# Patient Record
Sex: Male | Born: 1980 | Race: White | Hispanic: No | Marital: Single | Smoking: Former smoker
Health system: Southern US, Community
[De-identification: ages and names within clinical notes are randomized; demographics above are authoritative.]

## PROBLEM LIST (undated history)

## (undated) DIAGNOSIS — F319 Bipolar disorder, unspecified: Secondary | ICD-10-CM

## (undated) HISTORY — DX: Bipolar disorder, unspecified: F31.9

---
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
## 2015-06-28 ENCOUNTER — Other Ambulatory Visit: Payer: Self-pay | Admitting: Occupational Medicine

## 2015-06-28 ENCOUNTER — Ambulatory Visit: Payer: Self-pay

## 2015-06-28 DIAGNOSIS — Z0000 Encounter for general adult medical examination without abnormal findings: Secondary | ICD-10-CM

## 2016-08-05 IMAGING — CR DG CHEST 1V
1 series · 1 of 1 positions shown · non-contrast
Comparison: None.

CLINICAL DATA: Pre-employment physical, current tobacco use

EXAM:
CHEST  1 VIEW

[view not recorded]
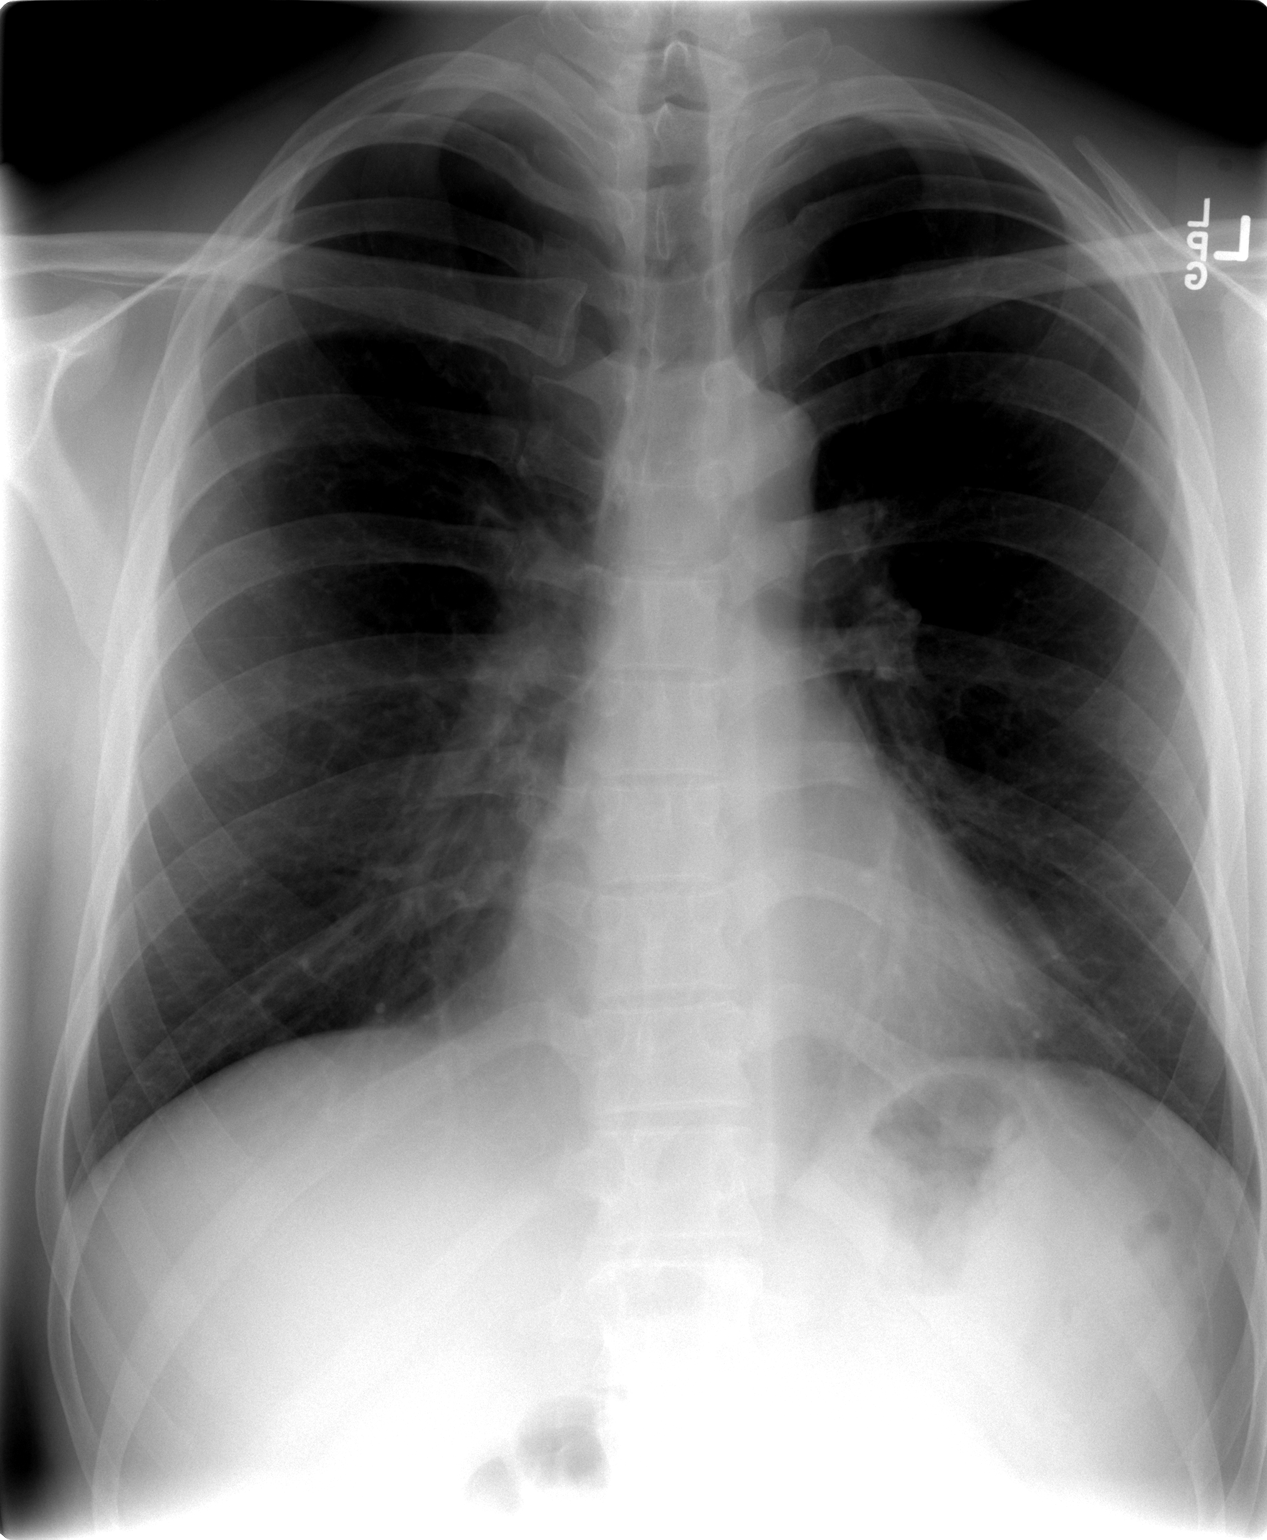

[1 of 1 positions shown; findings below may reference images not displayed]

FINDINGS: The heart size and mediastinal contours are within normal limits.
Both lungs are clear. The visualized skeletal structures are
unremarkable.
IMPRESSION: No active disease.

## 2016-11-19 ENCOUNTER — Encounter (HOSPITAL_COMMUNITY): Payer: Self-pay | Admitting: Psychiatry

## 2016-11-19 ENCOUNTER — Ambulatory Visit (INDEPENDENT_AMBULATORY_CARE_PROVIDER_SITE_OTHER): Payer: BLUE CROSS/BLUE SHIELD | Admitting: Psychiatry

## 2016-11-19 VITALS — BP 126/74 | HR 73 | Resp 16 | Ht 74.0 in | Wt 209.0 lb

## 2016-11-19 DIAGNOSIS — Z79899 Other long term (current) drug therapy: Secondary | ICD-10-CM

## 2016-11-19 DIAGNOSIS — F17210 Nicotine dependence, cigarettes, uncomplicated: Secondary | ICD-10-CM | POA: Diagnosis not present

## 2016-11-19 DIAGNOSIS — F5102 Adjustment insomnia: Secondary | ICD-10-CM

## 2016-11-19 DIAGNOSIS — Z818 Family history of other mental and behavioral disorders: Secondary | ICD-10-CM

## 2016-11-19 DIAGNOSIS — F3160 Bipolar disorder, current episode mixed, unspecified: Secondary | ICD-10-CM

## 2016-11-19 DIAGNOSIS — Z880 Allergy status to penicillin: Secondary | ICD-10-CM

## 2016-11-19 MED ORDER — LITHIUM CARBONATE 600 MG PO CAPS: 600.0000 mg | ORAL_CAPSULE | Freq: Every day | ORAL | 0 refills | Status: DC

## 2016-11-19 MED ORDER — TRAZODONE HCL 100 MG PO TABS: 100.0000 mg | ORAL_TABLET | Freq: Every evening | ORAL | 0 refills | Status: DC | PRN

## 2016-11-19 MED ORDER — LAMOTRIGINE 100 MG PO TABS: 100.0000 mg | ORAL_TABLET | Freq: Three times a day (TID) | ORAL | 0 refills | Status: DC
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
## 2016-11-20 LAB — BASIC METABOLIC PANEL
BUN: 9 mg/dL (ref 7–25)
CO2: 26 mmol/L (ref 20–31)
CREATININE: 1.13 mg/dL (ref 0.60–1.35)
Calcium: 9.6 mg/dL (ref 8.6–10.3)
Chloride: 104 mmol/L (ref 98–110)
Glucose, Bld: 84 mg/dL (ref 65–99)
POTASSIUM: 4.1 mmol/L (ref 3.5–5.3)
Sodium: 139 mmol/L (ref 135–146)

## 2016-11-20 LAB — LITHIUM LEVEL: LITHIUM LVL: 0.3 mmol/L — AB (ref 0.6–1.2)

## 2016-11-20 LAB — TSH: TSH: 7.63 m[IU]/L — AB (ref 0.40–4.50)

## 2016-12-11 ENCOUNTER — Other Ambulatory Visit (HOSPITAL_COMMUNITY): Payer: Self-pay | Admitting: *Deleted

## 2016-12-11 MED ORDER — LAMOTRIGINE 100 MG PO TABS: 100.0000 mg | ORAL_TABLET | Freq: Three times a day (TID) | ORAL | 0 refills | Status: DC

## 2016-12-11 MED ORDER — TRAZODONE HCL 100 MG PO TABS: 100.0000 mg | ORAL_TABLET | Freq: Every evening | ORAL | 0 refills | Status: DC | PRN

## 2016-12-11 MED ORDER — LITHIUM CARBONATE 600 MG PO CAPS: 600.0000 mg | ORAL_CAPSULE | Freq: Every day | ORAL | 0 refills | Status: DC

## 2016-12-11 NOTE — Telephone Encounter (Signed)
Pt request refills for Lamictal, Lithium and Trazodone. Per Dr. Gilmore LarocheAkhtar, refills are authorize for Lamictal 100mg , #90, Lithium 600mg , #30 and Trazodone 100mg , #30. [facility]. Called and informed pt of refill status. Pt's next apt is schedule on 01/16/17. Pt verbalizes understanding.

## 2016-12-12 ENCOUNTER — Ambulatory Visit (HOSPITAL_COMMUNITY): Payer: Self-pay | Admitting: Psychiatry

## 2017-01-16 ENCOUNTER — Ambulatory Visit (INDEPENDENT_AMBULATORY_CARE_PROVIDER_SITE_OTHER): Payer: BLUE CROSS/BLUE SHIELD | Admitting: Psychiatry

## 2017-01-16 ENCOUNTER — Encounter (HOSPITAL_COMMUNITY): Payer: Self-pay | Admitting: Psychiatry

## 2017-01-16 VITALS — BP 118/70 | HR 75 | Resp 16 | Ht 74.0 in | Wt 201.0 lb

## 2017-01-16 DIAGNOSIS — F5102 Adjustment insomnia: Secondary | ICD-10-CM

## 2017-01-16 DIAGNOSIS — F1290 Cannabis use, unspecified, uncomplicated: Secondary | ICD-10-CM

## 2017-01-16 DIAGNOSIS — F3160 Bipolar disorder, current episode mixed, unspecified: Secondary | ICD-10-CM

## 2017-01-16 DIAGNOSIS — Z818 Family history of other mental and behavioral disorders: Secondary | ICD-10-CM

## 2017-01-16 DIAGNOSIS — F17210 Nicotine dependence, cigarettes, uncomplicated: Secondary | ICD-10-CM

## 2017-01-16 DIAGNOSIS — Z79899 Other long term (current) drug therapy: Secondary | ICD-10-CM | POA: Diagnosis not present

## 2017-01-16 MED ORDER — LAMOTRIGINE 100 MG PO TABS: 100.0000 mg | ORAL_TABLET | Freq: Three times a day (TID) | ORAL | 2 refills | Status: DC

## 2017-01-16 MED ORDER — TRAZODONE HCL 100 MG PO TABS: 100.0000 mg | ORAL_TABLET | Freq: Every evening | ORAL | 2 refills | Status: DC | PRN

## 2017-01-16 MED ORDER — LITHIUM CARBONATE 600 MG PO CAPS: 600.0000 mg | ORAL_CAPSULE | Freq: Every day | ORAL | 2 refills | Status: DC

## 2017-01-16 NOTE — Progress Notes (Signed)
[facility] Outpatient Follow up visit   Patient Identification: Andre Steele MRN:  [id number] Date of Evaluation:  01/16/2017 Referral Source: self  Chief Complaint:   Chief Complaint    Follow-up     Visit Diagnosis:    ICD-9-CM ICD-10-CM   1. Bipolar I disorder, most recent episode mixed (HCC) 296.60 F31.60   2. 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 He initially referred himself for management of bipolar disorder and insomnia. He has seen dr. Claudina Lick at [facility] wants to change provider to here.  Last visit he continued his Lamictal, lithium. Have done blood work that shows high TSH. 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 He is a biker but says because of workload he cannot do the bike ride and otherwise that is a modifying factor. Insomnia : reasonable on trazadone    Substance Abuse History in the last 12 months:  Yes.   Marijuana use infrequent or during the weekends  Consequences of Substance Abuse: Medical Consequences:  mood liability  Past Medical History:  Past Medical History:  Diagnosis Date  . Bipolar disorder (HCC)    History reviewed. No pertinent surgical history.  Family Psychiatric History: says Dad probably had bipolar. Mom has anxiety  Family History:  Family History  Problem Relation Age of Onset  . Anxiety disorder Mother   . Anxiety disorder Sister   . Depression Sister   . ADD / ADHD Brother     Social History:   Social History   Social History  . Marital status: Single    Spouse name: N/A  . Number of children: N/A  . Years of education: N/A   Social History Main Topics  . Smoking status: Current Every Day Smoker    Packs/day: 0.25    Years: 17.00    Types: Cigarettes  . Smokeless tobacco: Never Used     Comment: reduce #  of cigarettes  . Alcohol use No  . Drug use: Yes    Types: Marijuana     Comment: weekends   . Sexual activity: Yes    Partners: Female   Other Topics Concern  . None   Social History Narrative  . None     Allergies:   Allergies  Allergen Reactions  . Penicillins Hives    Metabolic Disorder Labs: No results found for: HGBA1C, MPG No results found for: PROLACTIN No results found for: CHOL, TRIG, HDL, CHOLHDL, VLDL, LDLCALC   Current Medications: Current Outpatient Prescriptions  Medication Sig Dispense Refill  . lamoTRIgine (LAMICTAL) 100 MG tablet Take 1 tablet (100 mg total) by mouth 3 (three) times daily. 90 tablet 2  . lithium 600 MG capsule Take 1 capsule (600 mg total) by mouth at bedtime. 30 capsule 2  . traZODone (DESYREL) 100 MG tablet Take 1 tablet (100 mg total) by mouth at bedtime as needed. 30 tablet 2   No current facility-administered medications for this visit.       Psychiatric Specialty Exam: Review of Systems  Cardiovascular: Negative for chest pain and palpitations.  Gastrointestinal: Negative for nausea.  Skin: Negative for itching and rash.  Psychiatric/Behavioral: Negative for depression and suicidal ideas.    Blood pressure 118/70, pulse 75, resp. rate 16, height  (1.88 m), weight 201 lb (91.2 kg), SpO2 94 %.  Body mass index is 25.81 kg/m.  General Appearance: Casual  Eye Contact:  Fair  Speech:  Normal Rate  Volume:  Normal  Mood:  euthymic  Affect:  congruent  Thought Process:  Goal Directed  Orientation:  Full (Time, Place, and Person)  Thought Content:  Logical  Suicidal Thoughts:  No  Homicidal Thoughts:  No  Memory:  Immediate;   Fair Recent;   Fair  Judgement:  Fair  Insight:  Good  Psychomotor Activity:  Normal  Concentration:  Concentration: Fair and Attention Span: Fair  Recall:  Good  Fund of Knowledge:Good  Language: Good  Akathisia:  Negative  Handed:  Right  AIMS (if indicated):    Assets:  Financial  Resources/Insurance Social Support  ADL's:  Intact  Cognition: WNL  Sleep:  fair    Treatment Plan Summary: Medication management and Plan as follows  Bipolar possible mixed, remains in remission. Will continue lithium, lamictal TSH is 7.4. Will refer to primary care to rule out hypothyroidism. He does not feel tired or fatigued he describes possible that lithium may be contributing to high TSH she understands that but does not want to cut down on the lithium since it has been a helpful mood stabilizer.  Insomnia' baseline. Continue trazacone FU 3 months. Referred to primary care. Renewed meds No side effects, rash Gilmore Laroche, Harrietta Guardian, MD 4/12/20184:08 PM

## 2017-04-10 ENCOUNTER — Encounter (HOSPITAL_COMMUNITY): Payer: Self-pay | Admitting: Psychiatry

## 2017-04-10 ENCOUNTER — Ambulatory Visit (INDEPENDENT_AMBULATORY_CARE_PROVIDER_SITE_OTHER): Payer: BLUE CROSS/BLUE SHIELD | Admitting: Psychiatry

## 2017-04-10 VITALS — BP 124/70 | HR 73 | Resp 16 | Ht 74.0 in | Wt 204.0 lb

## 2017-04-10 DIAGNOSIS — F17210 Nicotine dependence, cigarettes, uncomplicated: Secondary | ICD-10-CM

## 2017-04-10 DIAGNOSIS — Z880 Allergy status to penicillin: Secondary | ICD-10-CM

## 2017-04-10 DIAGNOSIS — Z818 Family history of other mental and behavioral disorders: Secondary | ICD-10-CM

## 2017-04-10 DIAGNOSIS — G4700 Insomnia, unspecified: Secondary | ICD-10-CM

## 2017-04-10 DIAGNOSIS — F3170 Bipolar disorder, currently in remission, most recent episode unspecified: Secondary | ICD-10-CM

## 2017-04-10 DIAGNOSIS — F3160 Bipolar disorder, current episode mixed, unspecified: Secondary | ICD-10-CM

## 2017-04-10 DIAGNOSIS — Z79899 Other long term (current) drug therapy: Secondary | ICD-10-CM

## 2017-04-10 DIAGNOSIS — F5102 Adjustment insomnia: Secondary | ICD-10-CM

## 2017-04-10 MED ORDER — LITHIUM CARBONATE 600 MG PO CAPS: 600.0000 mg | ORAL_CAPSULE | Freq: Every day | ORAL | 5 refills | Status: DC

## 2017-04-10 MED ORDER — TRAZODONE HCL 100 MG PO TABS: 100.0000 mg | ORAL_TABLET | Freq: Every evening | ORAL | 5 refills | Status: DC | PRN

## 2017-04-10 MED ORDER — LAMOTRIGINE 100 MG PO TABS: 100.0000 mg | ORAL_TABLET | Freq: Three times a day (TID) | ORAL | 5 refills | Status: DC

## 2017-04-10 NOTE — Progress Notes (Signed)
[facility] Outpatient Follow up [facility]   Patient Identification: Andre EvesCorey Steele MRN:  [id number] Date of Evaluation:  04/10/2017 Referral Source: self  Chief Complaint:   Chief Complaint    Follow-up     Visit Diagnosis:    ICD-10-CM   1. Bipolar I disorder, most recent episode mixed (HCC) F31.60   2. 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 He initially referred himself for management of bipolar disorder and insomnia. He has [facility] wants to change provider to here.  History of sister's high last visit so was sent to primary care he has got his levels done and says primary care feels it is normal when they did the whole panel. 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 Sleep is adequate with trazodone No psychotic symptoms Sporadic or infrequent use of marijuana but understands the risk    Substance Abuse History in the last 12 months:  Yes.   Marijuana use infrequent or during the weekends  Consequences of Substance Abuse: Medical Consequences:  mood liability  Past Medical History:  Past Medical History:  Diagnosis Date  . Bipolar disorder (HCC)    History reviewed. No pertinent surgical history.  Family Psychiatric History: says Dad probably had bipolar. Mom has anxiety  Family History:  Family History  Problem Relation Age of Onset  . Anxiety disorder Mother   . Anxiety disorder Sister   . Depression Sister   . ADD / ADHD Brother     Social History:   Social History   Social History  . Marital status: Single    Spouse name: N/A  . Number of children: N/A  . Years of education: N/A   Social History Main Topics  . Smoking status: Current Every Day Smoker    Packs/day: 0.25    Years: 17.00    Types: Cigarettes  .  Smokeless tobacco: Never Used     Comment: reduce # of cigarettes  . Alcohol use No  . Drug use: Yes    Types: Marijuana     Comment: weekends   . Sexual activity: Yes    Partners: Female   Other Topics Concern  . None   Social History Narrative  . None     Allergies:   Allergies  Allergen Reactions  . Penicillins Hives    Metabolic Disorder Labs: No results found for: HGBA1C, MPG No results found for: PROLACTIN No results found for: CHOL, TRIG, HDL, CHOLHDL, VLDL, LDLCALC   Current Medications: Current Outpatient Prescriptions  Medication Sig Dispense Refill  . lamoTRIgine (LAMICTAL) 100 MG tablet Take 1 tablet (100 mg total) by mouth 3 (three) times daily. 90 tablet 5  . lithium 600 MG capsule Take 1 capsule (600 mg total) by mouth at bedtime. 30 capsule 5  . traZODone (DESYREL) 100 MG tablet Take 1 tablet (100 mg total) by mouth at bedtime as needed. 30 tablet 5   No current facility-administered medications for this visit.       Psychiatric Specialty Exam: Review of Systems  Cardiovascular: Negative for chest pain and palpitations.  Gastrointestinal: Negative for nausea.  Skin: Negative for itching and rash.  Neurological: Negative for tremors.  Psychiatric/Behavioral: Negative for depression and suicidal ideas.  Blood pressure 124/70, pulse 73, resp. rate 16, height 6\' 2"  (1.88 m), weight 204 lb (92.5 kg), SpO2 96 %.Body mass index is 26.19 kg/m.  General Appearance: Casual  Eye Contact:  Fair  Speech:  Normal Rate  Volume:  Normal  Mood:  euthymic  Affect:  congruent  Thought Process:  Goal Directed  Orientation:  Full (Time, Place, and Person)  Thought Content:  Logical  Suicidal Thoughts:  No  Homicidal Thoughts:  No  Memory:  Immediate;   Fair Recent;   Fair  Judgement:  Fair  Insight:  Good  Psychomotor Activity:  Normal  Concentration:  Concentration: Fair and Attention Span: Fair  Recall:  Good  Fund of Knowledge:Good  Language: Good   Akathisia:  Negative  Handed:  Right  AIMS (if indicated):    Assets:  Financial Resources/Insurance Social Support  ADL's:  Intact  Cognition: WNL  Sleep:  fair    Treatment Plan Summary: Medication management and Plan as follows  Bipolar possible mixed, in remission. Continue current meds.  TSH evaluated. Primary care following not hypothyroid   Insomnia'baseline. With trazadone Will send 6 months meds  FU early if needed  No side effects, rash Gilmore Laroche, Harrietta Guardian, MD 7/5/20184:17 PM

## 2017-10-09 ENCOUNTER — Encounter (HOSPITAL_COMMUNITY): Payer: Self-pay | Admitting: Psychiatry

## 2017-10-09 ENCOUNTER — Other Ambulatory Visit: Payer: Self-pay

## 2017-10-09 ENCOUNTER — Ambulatory Visit (INDEPENDENT_AMBULATORY_CARE_PROVIDER_SITE_OTHER): Payer: BLUE CROSS/BLUE SHIELD | Admitting: Psychiatry

## 2017-10-09 VITALS — BP 126/86 | HR 74 | Ht 74.0 in | Wt 213.0 lb

## 2017-10-09 DIAGNOSIS — F3160 Bipolar disorder, current episode mixed, unspecified: Secondary | ICD-10-CM | POA: Diagnosis not present

## 2017-10-09 DIAGNOSIS — F1210 Cannabis abuse, uncomplicated: Secondary | ICD-10-CM

## 2017-10-09 DIAGNOSIS — Z87891 Personal history of nicotine dependence: Secondary | ICD-10-CM

## 2017-10-09 DIAGNOSIS — Z818 Family history of other mental and behavioral disorders: Secondary | ICD-10-CM

## 2017-10-09 DIAGNOSIS — F5102 Adjustment insomnia: Secondary | ICD-10-CM | POA: Diagnosis not present

## 2017-10-09 MED ORDER — LAMOTRIGINE 100 MG PO TABS: 100.0000 mg | ORAL_TABLET | Freq: Three times a day (TID) | ORAL | 5 refills | Status: DC

## 2017-10-09 MED ORDER — TRAZODONE HCL 100 MG PO TABS: 100.0000 mg | ORAL_TABLET | Freq: Every evening | ORAL | 5 refills | Status: DC | PRN

## 2017-10-09 MED ORDER — LITHIUM CARBONATE 600 MG PO CAPS: 600.0000 mg | ORAL_CAPSULE | Freq: Every day | ORAL | 5 refills | Status: DC

## 2017-10-09 NOTE — Progress Notes (Signed)
[facility] Outpatient Follow up visit   Patient Identification: Andre Steele MRN:  [id number] Date of Evaluation:  10/09/2017 Referral Source: self  Chief Complaint:   Chief Complaint    Follow-up; Other     Visit Diagnosis:  No diagnosis found.  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 He initially referred himself for management of bipolar disorder and insomnia. He has seen dr. Claudina Steele at [facility] wants to change provider to here.  Doing fair on meds. Mood stable. Married, has a son and works  Sleep fair, energy fair   Substance Abuse History in the last 12 months:  Yes.   Marijuana use infrequent or during the weekends  Consequences of Substance Abuse: Medical Consequences:  mood liability  Past Medical History:  Past Medical History:  Diagnosis Date  . Bipolar disorder (HCC)    History reviewed. No pertinent surgical history.  Family Psychiatric History: says Dad probably had bipolar. Mom has anxiety  Family History:  Family History  Problem Relation Age of Onset  . Anxiety disorder Mother   . Anxiety disorder Sister   . Depression Sister   . ADD / ADHD Brother     Social History:   Social History   Socioeconomic History  . Marital status: Single    Spouse name: None  . Number of children: None  . Years of education: None  . Highest education level: None  Social Needs  . Financial resource strain: None  . Food insecurity - worry: None  . Food insecurity - inability: None  . Transportation needs - medical: None  . Transportation needs - non-medical: None  Occupational History  . None  Tobacco Use  . Smoking status: Former Smoker    Years: 17.00    Types: Cigarettes    Last attempt to quit: 06/09/2017    Years since quitting: 0.3  . Smokeless tobacco: Never Used  . Tobacco comment: reduce # of cigarettes  Substance and Sexual Activity  . Alcohol use: No   . Drug use: Yes    Types: Marijuana    Comment: weekends   . Sexual activity: Yes    Partners: Female  Other Topics Concern  . None  Social History Narrative  . None     Allergies:   Allergies  Allergen Reactions  . Penicillins Hives    Metabolic Disorder Labs: No results found for: HGBA1C, MPG No results found for: PROLACTIN No results found for: CHOL, TRIG, HDL, CHOLHDL, VLDL, LDLCALC   Current Medications: Current Outpatient Medications  Medication Sig Dispense Refill  . lamoTRIgine (LAMICTAL) 100 MG tablet Take 1 tablet (100 mg total) by mouth 3 (three) times daily. 90 tablet 5  . lithium 600 MG capsule Take 1 capsule (600 mg total) by mouth at bedtime. 30 capsule 5  . traZODone (DESYREL) 100 MG tablet Take 1 tablet (100 mg total) by mouth at bedtime as needed. 30 tablet 5   No current facility-administered medications for this visit.       Psychiatric Specialty Exam: Review of Systems  Cardiovascular: Negative for chest pain and palpitations.  Gastrointestinal: Negative for nausea.  Skin: Negative for itching and rash.  Neurological: Negative for tremors.  Psychiatric/Behavioral: Negative for depression and suicidal ideas.    Blood pressure 126/86, pulse 74, height 6\' 2"  (1.88 m), weight 213 lb (96.6 kg).Body mass index is 27.35 kg/m.  General Appearance: Casual  Eye Contact:  Fair  Speech:  Normal Rate  Volume:  Normal  Mood: fair  Affect:  congruent  Thought Process:  Goal Directed  Orientation:  Full (Time, Place, and Person)  Thought Content:  Logical  Suicidal Thoughts:  No  Homicidal Thoughts:  No  Memory:  Immediate;   Fair Recent;   Fair  Judgement:  Fair  Insight:  Good  Psychomotor Activity:  Normal  Concentration:  Concentration: Fair and Attention Span: Fair  Recall:  Good  Fund of Knowledge:Good  Language: Good  Akathisia:  Negative  Handed:  Right  AIMS (if indicated):    Assets:  Financial Resources/Insurance Social Support   ADL's:  Intact  Cognition: WNL  Sleep:  fair    Treatment Plan Summary: Medication management and Plan as follows  Bipolar possible mixed, in remission. Stable. Continue meds TSH evaluated. Primary care following not hypothyroid   Insomnia'baseline. Continue with trazadone Lithium level were low. No need to increase dose as he is stable. Fu 6 months. Renewed meds.  Andre Steele'    Andre Weedon, MD 1/3/20194:40 PM

## 2018-03-31 ENCOUNTER — Ambulatory Visit (HOSPITAL_COMMUNITY): Payer: Self-pay | Admitting: Psychiatry

## 2018-04-06 ENCOUNTER — Encounter (HOSPITAL_COMMUNITY): Payer: Self-pay | Admitting: Psychiatry

## 2018-04-06 ENCOUNTER — Ambulatory Visit (INDEPENDENT_AMBULATORY_CARE_PROVIDER_SITE_OTHER): Payer: BLUE CROSS/BLUE SHIELD | Admitting: Psychiatry

## 2018-04-06 ENCOUNTER — Other Ambulatory Visit: Payer: Self-pay

## 2018-04-06 VITALS — BP 142/90 | HR 77 | Ht 74.0 in | Wt 201.0 lb

## 2018-04-06 DIAGNOSIS — F3160 Bipolar disorder, current episode mixed, unspecified: Secondary | ICD-10-CM | POA: Diagnosis not present

## 2018-04-06 DIAGNOSIS — Z818 Family history of other mental and behavioral disorders: Secondary | ICD-10-CM

## 2018-04-06 DIAGNOSIS — F5102 Adjustment insomnia: Secondary | ICD-10-CM

## 2018-04-06 DIAGNOSIS — Z87891 Personal history of nicotine dependence: Secondary | ICD-10-CM | POA: Diagnosis not present

## 2018-04-06 MED ORDER — TRAZODONE HCL 100 MG PO TABS: 100.0000 mg | ORAL_TABLET | Freq: Every evening | ORAL | 6 refills | Status: DC | PRN

## 2018-04-06 MED ORDER — LAMOTRIGINE 100 MG PO TABS: 100.0000 mg | ORAL_TABLET | Freq: Three times a day (TID) | ORAL | 6 refills | Status: DC

## 2018-04-06 MED ORDER — LITHIUM CARBONATE 600 MG PO CAPS: 600.0000 mg | ORAL_CAPSULE | Freq: Every day | ORAL | 6 refills | Status: DC

## 2018-04-06 NOTE — Progress Notes (Signed)
[facility] Outpatient Follow up visit   Patient Identification: Andre Steele MRN:  [id number] Date of Evaluation:  04/06/2018 Referral Source: self  Chief Complaint:   Chief Complaint    Follow-up; Other     Visit Diagnosis:    ICD-10-CM   1. Bipolar I disorder, most recent episode mixed (HCC) F31.60 Lithium level    TSH + free T4    CBC  2. Adjustment insomnia F51.02     History of Present Illness: 37 years old currently single Caucasian male living with his fiance and 83 years old son working as an Personnel officer. He initially referred himself for management of bipolar disorder and insomnia. He has seen dr. Claudina Lick at [facility] wants to change provider to here.  Stable on meds.  onlithium  Sleep fair on trazadone   Modifying factor; son and job  Substance Abuse History in the last 12 months:  Yes.   Marijuana use infrequent or during the weekends  Consequences of Substance Abuse: Medical Consequences:  mood liability  Past Medical History:  Past Medical History:  Diagnosis Date  . Bipolar disorder (HCC)    History reviewed. No pertinent surgical history.  Family Psychiatric History: says Dad probably had bipolar. Mom has anxiety  Family History:  Family History  Problem Relation Age of Onset  . Anxiety disorder Mother   . Anxiety disorder Sister   . Depression Sister   . ADD / ADHD Brother     Social History:   Social History   Socioeconomic History  . Marital status: Single    Spouse name: Not on file  . Number of children: Not on file  . Years of education: Not on file  . Highest education level: Not on file  Occupational History  . Not on file  Social Needs  . Financial resource strain: Not on file  . Food insecurity:    Worry: Not on file    Inability: Not on file  . Transportation needs:    Medical: Not on file    Non-medical: Not on file  Tobacco Use  . Smoking status: Former Smoker    Years: 17.00    Types: Cigarettes    Last attempt to quit:  06/09/2017    Years since quitting: 0.8  . Smokeless tobacco: Never Used  . Tobacco comment: reduce # of cigarettes  Substance and Sexual Activity  . Alcohol use: No  . Drug use: Yes    Types: Marijuana    Comment: weekends   . Sexual activity: Yes    Partners: Female  Lifestyle  . Physical activity:    Days per week: Not on file    Minutes per session: Not on file  . Stress: Not on file  Relationships  . Social connections:    Talks on phone: Not on file    Gets together: Not on file    Attends religious service: Not on file    Active member of club or organization: Not on file    Attends meetings of clubs or organizations: Not on file    Relationship status: Not on file  Other Topics Concern  . Not on file  Social History Narrative  . Not on file     Allergies:   Allergies  Allergen Reactions  . Penicillins Hives    Metabolic Disorder Labs: No results found for: HGBA1C, MPG No results found for: PROLACTIN No results found for: CHOL, TRIG, HDL, CHOLHDL, VLDL, LDLCALC   Current Medications: Current Outpatient Medications  Medication  Sig Dispense Refill  . lamoTRIgine (LAMICTAL) 100 MG tablet Take 1 tablet (100 mg total) by mouth 3 (three) times daily. 90 tablet 6  . lithium 600 MG capsule Take 1 capsule (600 mg total) by mouth at bedtime. 30 capsule 6  . traZODone (DESYREL) 100 MG tablet Take 1 tablet (100 mg total) by mouth at bedtime as needed. 30 tablet 6   No current facility-administered medications for this visit.       Psychiatric Specialty Exam: Review of Systems  Cardiovascular: Negative for chest pain.  Gastrointestinal: Negative for nausea.  Skin: Negative for itching and rash.  Neurological: Negative for tremors.  Psychiatric/Behavioral: Negative for depression and suicidal ideas.    Blood pressure (!) 142/90, pulse 77, height 6\' 2"  (1.88 m), weight 201 lb (91.2 kg).Body mass index is 25.81 kg/m.  General Appearance: Casual  Eye Contact:   Fair  Speech:  Normal Rate  Volume:  Normal  Mood: fair  Affect:  congruent  Thought Process:  Goal Directed  Orientation:  Full (Time, Place, and Person)  Thought Content:  Logical  Suicidal Thoughts:  No  Homicidal Thoughts:  No  Memory:  Immediate;   Fair Recent;   Fair  Judgement:  Fair  Insight:  Good  Psychomotor Activity:  Normal  Concentration:  Concentration: Fair and Attention Span: Fair  Recall:  Good  Fund of Knowledge:Good  Language: Good  Akathisia:  Negative  Handed:  Right  AIMS (if indicated):    Assets:  Financial Resources/Insurance Social Support  ADL's:  Intact  Cognition: WNL  Sleep:  fair    Treatment Plan Summary: Medication management and Plan as follows  Bipolar possible mixed,stable. Continue lamictal. Lithium. No rahs  will send for labs work and lithium levels    Insomnia' baseline. Reviewed sleep hygine, continue trazadone and at times take half tablet  Fu 7275m . Renewed meds Andre Steele'    Andre Giddens, MD 7/1/20193:46 PM

## 2018-04-06 NOTE — Patient Instructions (Signed)
PRIMARY CARE TO DO LABWORK INCLUDING TSH FREE T4 LEVELS LITHIUM AND CBC,  PLEASE LET US KNOW RESULTS

## 2018-04-07 LAB — TSH+FREE T4: TSH W/REFLEX TO FT4: 8.49 m[IU]/L — AB (ref 0.40–4.50)

## 2018-04-07 LAB — CBC
HEMATOCRIT: 42.7 % (ref 38.5–50.0)
Hemoglobin: 15.2 g/dL (ref 13.2–17.1)
MCH: 32.2 pg (ref 27.0–33.0)
MCHC: 35.6 g/dL (ref 32.0–36.0)
MCV: 90.5 fL (ref 80.0–100.0)
MPV: 9.6 fL (ref 7.5–12.5)
PLATELETS: 250 10*3/uL (ref 140–400)
RBC: 4.72 10*6/uL (ref 4.20–5.80)
RDW: 12 % (ref 11.0–15.0)
WBC: 9.3 10*3/uL (ref 3.8–10.8)

## 2018-04-07 LAB — T4, FREE: Free T4: 1.1 ng/dL (ref 0.8–1.8)

## 2018-04-07 LAB — LITHIUM LEVEL: LITHIUM LVL: 0.4 mmol/L — AB (ref 0.6–1.2)

## 2018-11-05 ENCOUNTER — Other Ambulatory Visit: Payer: Self-pay

## 2018-11-05 ENCOUNTER — Ambulatory Visit (INDEPENDENT_AMBULATORY_CARE_PROVIDER_SITE_OTHER): Payer: BLUE CROSS/BLUE SHIELD | Admitting: Psychiatry

## 2018-11-05 ENCOUNTER — Encounter (HOSPITAL_COMMUNITY): Payer: Self-pay | Admitting: Psychiatry

## 2018-11-05 VITALS — BP 118/78 | HR 76 | Ht 74.0 in | Wt 213.0 lb

## 2018-11-05 DIAGNOSIS — F5102 Adjustment insomnia: Secondary | ICD-10-CM

## 2018-11-05 DIAGNOSIS — F3160 Bipolar disorder, current episode mixed, unspecified: Secondary | ICD-10-CM | POA: Diagnosis not present

## 2018-11-05 MED ORDER — TRAZODONE HCL 100 MG PO TABS: 100.0000 mg | ORAL_TABLET | Freq: Every evening | ORAL | 5 refills | Status: DC | PRN

## 2018-11-05 MED ORDER — LAMOTRIGINE 100 MG PO TABS: 100.0000 mg | ORAL_TABLET | Freq: Three times a day (TID) | ORAL | 5 refills | Status: DC

## 2018-11-05 NOTE — Progress Notes (Signed)
[facility] Outpatient Follow up visit   Patient Identification: Deangleo Kubecka MRN:  [id number] Date of Evaluation:  11/05/2018 Referral Source: self  Chief Complaint:   Chief Complaint    Follow-up; Other     Visit Diagnosis:    ICD-10-CM   1. Bipolar I disorder, most recent episode mixed (HCC) F31.60   2. Adjustment insomnia F51.02     History of Present Illness: 38 years old currently single Caucasian male living with his fiance and 74 years old son working as an Personnel officer. He initially referred himself for management of bipolar disorder and insomnia. He has seen dr. Claudina Lick at [facility] wants to change provider to here.  Remains stable on medication on lithium and Lamictal trazodone helps him sleep modifying factor is his job his wife  Labs were done last year he does not want to do it again otherwise comfortable and no side effects reported   Substance Abuse History in the last 12 months:  Yes.   Marijuana use infrequent or during the weekends  Consequences of Substance Abuse: Medical Consequences:  mood liability  Past Medical History:  Past Medical History:  Diagnosis Date  . Bipolar disorder (HCC)    History reviewed. No pertinent surgical history.  Family Psychiatric History: says Dad probably had bipolar. Mom has anxiety  Family History:  Family History  Problem Relation Age of Onset  . Anxiety disorder Mother   . Anxiety disorder Sister   . Depression Sister   . ADD / ADHD Brother     Social History:   Social History   Socioeconomic History  . Marital status: Single    Spouse name: Not on file  . Number of children: Not on file  . Years of education: Not on file  . Highest education level: Not on file  Occupational History  . Not on file  Social Needs  . Financial resource strain: Not on file  . Food insecurity:    Worry: Not on file    Inability: Not on file  . Transportation needs:    Medical: Not on file    Non-medical: Not on file  Tobacco Use   . Smoking status: Former Smoker    Years: 17.00    Types: Cigarettes    Last attempt to quit: 06/09/2017    Years since quitting: 1.4  . Smokeless tobacco: Never Used  . Tobacco comment: reduce # of cigarettes  Substance and Sexual Activity  . Alcohol use: No  . Drug use: Yes    Types: Marijuana    Comment: weekends   . Sexual activity: Yes    Partners: Female  Lifestyle  . Physical activity:    Days per week: Not on file    Minutes per session: Not on file  . Stress: Not on file  Relationships  . Social connections:    Talks on phone: Not on file    Gets together: Not on file    Attends religious service: Not on file    Active member of club or organization: Not on file    Attends meetings of clubs or organizations: Not on file    Relationship status: Not on file  Other Topics Concern  . Not on file  Social History Narrative  . Not on file     Allergies:   Allergies  Allergen Reactions  . Penicillins Hives    Metabolic Disorder Labs: No results found for: HGBA1C, MPG No results found for: PROLACTIN No results found for: CHOL, TRIG, HDL,  CHOLHDL, VLDL, LDLCALC   Current Medications: Current Outpatient Medications  Medication Sig Dispense Refill  . lamoTRIgine (LAMICTAL) 100 MG tablet Take 1 tablet (100 mg total) by mouth 3 (three) times daily. 90 tablet 5  . lithium 600 MG capsule Take 1 capsule (600 mg total) by mouth at bedtime. 30 capsule 6  . Multiple Vitamin (THERA) TABS Take by mouth.    . traZODone (DESYREL) 100 MG tablet Take 1 tablet (100 mg total) by mouth at bedtime as needed. 30 tablet 5   No current facility-administered medications for this visit.       Psychiatric Specialty Exam: Review of Systems  Cardiovascular: Negative for palpitations.  Gastrointestinal: Negative for nausea.  Skin: Negative for itching and rash.  Neurological: Negative for tremors.  Psychiatric/Behavioral: Negative for depression and suicidal ideas.    Blood  pressure 118/78, pulse 76, height 6\' 2"  (1.88 m), weight 213 lb (96.6 kg).Body mass index is 27.35 kg/m.  General Appearance: Casual  Eye Contact:  Fair  Speech:  Normal Rate  Volume:  Normal  Mood:fair.   Affect:  congruent  Thought Process:  Goal Directed  Orientation:  Full (Time, Place, and Person)  Thought Content:  Logical  Suicidal Thoughts:  No  Homicidal Thoughts:  No  Memory:  Immediate;   Fair Recent;   Fair  Judgement:  Fair  Insight:  Good  Psychomotor Activity:  Normal  Concentration:  Concentration: Fair and Attention Span: Fair  Recall:  Good  Fund of Knowledge:Good  Language: Good  Akathisia:  Negative  Handed:  Right  AIMS (if indicated):    Assets:  Financial Resources/Insurance Social Support  ADL's:  Intact  Cognition: WNL  Sleep:  fair    Treatment Plan Summary: Medication management and Plan as follows  Bipolar possible mixed, stable. Reviewed prior labs. Renew meds   Insomnia' baseline. Reviewed sleep hygine, continue trazadone and at times take half tablet  Fu 5861m . Renewed meds [name]'    Vanellope Passmore, MD 1/30/20203:36 PM

## 2018-11-18 ENCOUNTER — Other Ambulatory Visit (HOSPITAL_COMMUNITY): Payer: Self-pay | Admitting: Psychiatry

## 2018-12-25 ENCOUNTER — Other Ambulatory Visit (HOSPITAL_COMMUNITY): Payer: Self-pay | Admitting: Psychiatry

## 2019-03-24 ENCOUNTER — Other Ambulatory Visit (HOSPITAL_COMMUNITY): Payer: Self-pay | Admitting: Psychiatry

## 2019-05-24 ENCOUNTER — Other Ambulatory Visit (HOSPITAL_COMMUNITY): Payer: Self-pay | Admitting: Psychiatry

## 2019-06-01 ENCOUNTER — Ambulatory Visit (INDEPENDENT_AMBULATORY_CARE_PROVIDER_SITE_OTHER): Payer: Self-pay | Admitting: Psychiatry

## 2019-06-01 ENCOUNTER — Encounter (HOSPITAL_COMMUNITY): Payer: Self-pay | Admitting: Psychiatry

## 2019-06-01 ENCOUNTER — Other Ambulatory Visit (HOSPITAL_COMMUNITY): Payer: Self-pay | Admitting: Psychiatry

## 2019-06-01 DIAGNOSIS — F3160 Bipolar disorder, current episode mixed, unspecified: Secondary | ICD-10-CM

## 2019-06-01 DIAGNOSIS — F5102 Adjustment insomnia: Secondary | ICD-10-CM

## 2019-06-01 MED ORDER — TRAZODONE HCL 100 MG PO TABS: 100.0000 mg | ORAL_TABLET | Freq: Every evening | ORAL | 4 refills | Status: DC | PRN

## 2019-06-01 MED ORDER — LITHIUM CARBONATE 600 MG PO CAPS: ORAL_CAPSULE | ORAL | 5 refills | Status: DC

## 2019-06-01 MED ORDER — LAMOTRIGINE 100 MG PO TABS: 100.0000 mg | ORAL_TABLET | Freq: Three times a day (TID) | ORAL | 5 refills | Status: DC

## 2019-06-01 NOTE — Progress Notes (Addendum)
[facility] Out[facility] Follow [facility]   Patient Identification: Andre EvesCorey Ruane MRN:  [id number] Date of Evaluation:  06/01/2019 Referral Source: self  Chief Complaint:    bipolar follow up  Visit Diagnosis:    ICD-10-CM   1. Bipolar I disorder, most recent episode mixed (HCC)  F31.60   2. Adjustment insomnia  F51.02    Virtual Visit via Telephone Note  I connected with Andre Steele on 06/01/19 at  3:30 PM EDT by telephone and verified that I am speaking with the correct person using two identifiers.   I discussed the limitations, risks, security and privacy concerns of performing an evaluation and management service by telephone and the availability of in person appointments. I also discussed with the patient that there may be a patient responsible charge related to this service. The patient expressed understanding and agreed to proceed. 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 He initially referred himself for management of bipolar disorder and insomnia. [facility] wants to change provider to here.  Remains stable on meds. No side effects or rash Wants to do lab work later months Supportive family Work is fine Substance Abuse History in the last 12 months:  Yes.   but did not endorse regular use  Marijuana use infrequent or during the weekends  Consequences of Substance Abuse: Medical Consequences:  mood liability  Past Medical History:  Past Medical History:  Diagnosis Date  . Bipolar disorder (HCC)    History reviewed. No pertinent surgical history.  Family Psychiatric History: says Dad probably had bipolar. Mom has anxiety  Family History:  Family History  Problem Relation Age of Onset  . Anxiety disorder Mother   . Anxiety disorder Sister   . Depression Sister   . ADD / ADHD Brother     Social History:   Social History   Socioeconomic History   . Marital status: Single    Spouse name: Not on file  . Number of children: Not on file  . Years of education: Not on file  . Highest education level: Not on file  Occupational History  . Not on file  Social Needs  . Financial resource strain: Not on file  . Food insecurity    Worry: Not on file    Inability: Not on file  . Transportation needs    Medical: Not on file    Non-medical: Not on file  Tobacco Use  . Smoking status: Former Smoker    Years: 17.00    Types: Cigarettes    Quit date: 06/09/2017    Years since quitting: 1.9  . Smokeless tobacco: Never Used  . Tobacco comment: reduce # of cigarettes  Substance and Sexual Activity  . Alcohol use: No  . Drug use: Yes    Types: Marijuana    Comment: weekends   . Sexual activity: Yes    Partners: Female  Lifestyle  . Physical activity    Days per week: Not on file    Minutes per session: Not on file  . Stress: Not on file  Relationships  . Social Musicianconnections    Talks on phone: Not on file    Gets together: Not on file    Attends religious service: Not on file    Active member of club or organization: Not on file    Attends meetings of clubs or organizations: Not on file  Relationship status: Not on file  Other Topics Concern  . Not on file  Social History Narrative  . Not on file     Allergies:   Allergies  Allergen Reactions  . Penicillins Hives    Metabolic Disorder Labs: No results found for: HGBA1C, MPG No results found for: PROLACTIN No results found for: CHOL, TRIG, HDL, CHOLHDL, VLDL, LDLCALC   Current Medications: Current Outpatient Medications  Medication Sig Dispense Refill  . lamoTRIgine (LAMICTAL) 100 MG tablet Take 1 tablet (100 mg total) by mouth 3 (three) times daily. 90 tablet 5  . lithium 600 MG capsule Take 1 capsule by mouth once daily at bedtime 30 capsule 5  . Multiple Vitamin (THERA) TABS Take by mouth.    . traZODone (DESYREL) 100 MG tablet Take 1 tablet (100 mg total) by  mouth at bedtime as needed. 30 tablet 4   No current facility-administered medications for this visit.       Psychiatric Specialty Exam: Review of Systems  Cardiovascular: Negative for palpitations.  Skin: Negative for itching and rash.  Psychiatric/Behavioral: Negative for depression and suicidal ideas.    There were no vitals taken for this visit.There is no height or weight on file to calculate BMI.  General Appearance:  Eye Contact:  Speech:  Normal Rate  Volume:  Normal  Mood:fair  Affect:  congruent  Thought Process:  Goal Directed  Orientation:  Full (Time, Place, and Person)  Thought Content:  Logical  Suicidal Thoughts:  No  Homicidal Thoughts:  No  Memory:  Immediate;   Fair Recent;   Fair  Judgement:  Fair  Insight:  Good  Psychomotor Activity:  Normal  Concentration:  Concentration: Fair and Attention Span: Fair  Recall:  Good  Fund of Knowledge:Good  Language: Good  Akathisia:  Negative  Handed:  Right  AIMS (if indicated):    Assets:  Financial Resources/Insurance Social Support  ADL's:  Intact  Cognition: WNL  Sleep:  fair    Treatment Plan Summary: Medication management and Plan as follows  Bipolar possible mixed: remain stable. Continue lithium, lamictal  Insomnia' baseline. Reviewed sleep hygine, continue trazadone and at times take half tablet  Fu 69m . Renewed meds '  I discussed the assessment and treatment plan with the patient. The patient was provided an opportunity to ask questions and all were answered. The patient agreed with the plan and demonstrated an understanding of the instructions.   The patient was advised to call back or seek an in-person evaluation if the symptoms worsen or if the condition fails to improve as anticipated.  Time spent non face to face 75min Merian Capron, MD 8/25/20203:36 PM

## 2019-12-03 ENCOUNTER — Other Ambulatory Visit (HOSPITAL_COMMUNITY): Payer: Self-pay | Admitting: Psychiatry

## 2019-12-27 ENCOUNTER — Ambulatory Visit (INDEPENDENT_AMBULATORY_CARE_PROVIDER_SITE_OTHER): Payer: BC Managed Care – PPO | Admitting: Psychiatry

## 2019-12-27 ENCOUNTER — Encounter (HOSPITAL_COMMUNITY): Payer: Self-pay | Admitting: Psychiatry

## 2019-12-27 DIAGNOSIS — F3160 Bipolar disorder, current episode mixed, unspecified: Secondary | ICD-10-CM

## 2019-12-27 DIAGNOSIS — F5102 Adjustment insomnia: Secondary | ICD-10-CM | POA: Diagnosis not present

## 2019-12-27 MED ORDER — TRAZODONE HCL 100 MG PO TABS: 100.0000 mg | ORAL_TABLET | Freq: Every evening | ORAL | 3 refills | Status: DC | PRN

## 2019-12-27 MED ORDER — LAMOTRIGINE 100 MG PO TABS: 100.0000 mg | ORAL_TABLET | Freq: Three times a day (TID) | ORAL | 4 refills | Status: DC

## 2019-12-27 MED ORDER — LITHIUM CARBONATE 600 MG PO CAPS: ORAL_CAPSULE | ORAL | 4 refills | Status: DC

## 2019-12-27 NOTE — Progress Notes (Signed)
[facility] Outpatient Follow up visit   Patient Identification: Andre Steele MRN:  [id number] Date of Evaluation:  12/27/2019 Referral Source: self  Chief Complaint:    bipolar follow up   Visit Diagnosis:    ICD-10-CM   1. Bipolar I disorder, most recent episode mixed ([place])  F31.60 Lithium level    TSH  2. Adjustment insomnia  F51.02    Virtual Visit via Telephone Note   I connected with Andre Steele on 12/27/19 at  4:00 PM EDT by telephone and verified that I am speaking with the correct person using two identifiers.   I discussed the limitations, risks, security and privacy concerns of performing an evaluation and management service by telephone and the availability of in person appointments. I also discussed with the patient that there may be a patient responsible charge related to this service. The patient expressed understanding and agreed to proceed. 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 He initially referred himself for management of bipolar disorder and insomnia. He has seen dr. Almon Register at [facility] wants to change provider.  Doing fair mood wise Still not got blood work so will write lithium, tsh today No chest pain Modifying factor; work Stress; grand father sick  Psychosis none Duration ; more then 10 years plus Substance Abuse History in the last 12 months:  Yes.   but did not endorse regular use  Marijuana use infrequent or during the weekends  Consequences of Substance Abuse: Medical Consequences:  mood liability  Past Medical History:  Past Medical History:  Diagnosis Date  . Bipolar disorder ([place])    History reviewed. No pertinent surgical history.  Family Psychiatric History: says Dad probably had bipolar. Mom has anxiety  Family History:  Family History  Problem Relation Age of Onset  . Anxiety disorder Mother   . Anxiety disorder Sister   .  Depression Sister   . ADD / ADHD Brother     Social History:   Social History   Socioeconomic History  . Marital status: Single    Spouse name: Not on file  . Number of children: Not on file  . Years of education: Not on file  . Highest education level: Not on file  Occupational History  . Not on file  Tobacco Use  . Smoking status: Former Smoker    Years: 17.00    Types: Cigarettes    Quit date: 06/09/2017    Years since quitting: 2.5  . Smokeless tobacco: Never Used  . Tobacco comment: reduce # of cigarettes  Substance and Sexual Activity  . Alcohol use: No  . Drug use: Yes    Types: Marijuana    Comment: weekends   . Sexual activity: Yes    Partners: Female  Other Topics Concern  . Not on file  Social History Narrative  . Not on file   Social Determinants of Health   Financial Resource Strain:   . Difficulty of Paying Living Expenses:   Food Insecurity:   . Worried About Steele fundraiser in the Last Year:   . Arboriculturist in the Last Year:   Transportation Needs:   . Film/video editor (Medical):   Marland Kitchen Lack of Transportation (Non-Medical):   Physical Activity:   . Days of Exercise per Week:   . Minutes of Exercise per Session:   Stress:   . Feeling of Stress :  Social Connections:   . Frequency of Communication with Friends and Family:   . Frequency of Social Gatherings with Friends and Family:   . Attends Religious Services:   . Active Member of Clubs or Organizations:   . Attends Banker Meetings:   Marland Kitchen Marital Status:      Allergies:   Allergies  Allergen Reactions  . Penicillins Hives    Metabolic Disorder Labs: No results found for: HGBA1C, MPG No results found for: PROLACTIN No results found for: CHOL, TRIG, HDL, CHOLHDL, VLDL, LDLCALC   Current Medications: Current Outpatient Medications  Medication Sig Dispense Refill  . lamoTRIgine (LAMICTAL) 100 MG tablet Take 1 tablet (100 mg total) by mouth 3 (three) times  daily. 90 tablet 4  . lithium 600 MG capsule Take at night 30 capsule 4  . Multiple Vitamin (THERA) TABS Take by mouth.    . traZODone (DESYREL) 100 MG tablet Take 1 tablet (100 mg total) by mouth at bedtime as needed. 30 tablet 3   No current facility-administered medications for this visit.      Psychiatric Specialty Exam: Review of Systems  Cardiovascular: Negative for palpitations.  Skin: Negative for rash.  Psychiatric/Behavioral: Negative for depression and suicidal ideas.    There were no vitals taken for this visit.There is no height or weight on file to calculate BMI.  General Appearance:  Eye Contact:  Speech:  Normal Rate  Volume:  Normal  Mood:fair  Affect:  congruent  Thought Process:  Goal Directed  Orientation:  Full (Time, Place, and Person)  Thought Content:  Logical  Suicidal Thoughts:  No  Homicidal Thoughts:  No  Memory:  Immediate;   Fair Recent;   Fair  Judgement:  Fair  Insight:  Good  Psychomotor Activity:  Normal  Concentration:  Concentration: Fair and Attention Span: Fair  Recall:  Good  Fund of Knowledge:Good  Language: Good  Akathisia:  Negative  Handed:  Right  AIMS (if indicated):    Assets:  Financial Resources/Insurance Social Support  ADL's:  Intact  Cognition: WNL  Sleep:  fair    Treatment Plan Summary: Medication management and Plan as follows  Bipolar possible mixed:  Stable. Continue lithium, lamictal  no rash Will send blood work request as above Insomnia' baseline, continue trazadone prn or regular Fu 6 m . Renewed meds '  I discussed the assessment and treatment plan with the patient. The patient was provided an opportunity to ask questions and all were answered. The patient agreed with the plan and demonstrated an understanding of the instructions.   The patient was advised to call back or seek an in-person evaluation if the symptoms worsen or if the condition fails to improve as anticipated.  Time spent non face  to face [name], MD 3/22/20214:08 PM

## 2020-05-01 ENCOUNTER — Other Ambulatory Visit (HOSPITAL_COMMUNITY): Payer: Self-pay | Admitting: Psychiatry

## 2020-06-08 ENCOUNTER — Other Ambulatory Visit (HOSPITAL_COMMUNITY): Payer: Self-pay | Admitting: Psychiatry

## 2020-06-27 ENCOUNTER — Encounter (HOSPITAL_COMMUNITY): Payer: Self-pay | Admitting: Psychiatry

## 2020-06-27 ENCOUNTER — Telehealth (INDEPENDENT_AMBULATORY_CARE_PROVIDER_SITE_OTHER): Payer: BC Managed Care – PPO | Admitting: Psychiatry

## 2020-06-27 DIAGNOSIS — F3160 Bipolar disorder, current episode mixed, unspecified: Secondary | ICD-10-CM

## 2020-06-27 DIAGNOSIS — F5102 Adjustment insomnia: Secondary | ICD-10-CM | POA: Diagnosis not present

## 2020-06-27 MED ORDER — TRAZODONE HCL 100 MG PO TABS: 100.0000 mg | ORAL_TABLET | Freq: Every evening | ORAL | 5 refills | Status: DC | PRN

## 2020-06-27 MED ORDER — LITHIUM CARBONATE 600 MG PO CAPS: ORAL_CAPSULE | ORAL | 5 refills | Status: DC

## 2020-06-27 MED ORDER — LAMOTRIGINE 100 MG PO TABS: 100.0000 mg | ORAL_TABLET | Freq: Three times a day (TID) | ORAL | 4 refills | Status: DC

## 2020-06-27 NOTE — Progress Notes (Signed)
[facility] Outpatient Follow up visit   Patient Identification: Andre Steele MRN:  [id number] Date of Evaluation:  06/27/2020 Referral Source: self  Chief Complaint:    bipolar follow up   Visit Diagnosis:    ICD-10-CM   1. Bipolar I disorder, most recent episode mixed (HCC)  F31.60   2. Adjustment insomnia  F51.02    Virtual Visit via Telephone Note   I connected with Andre Steele on 06/27/20 at  4:00 PM EDT by telephone and verified that I am speaking with the correct person using two identifiers.  I discussed the limitations, risks, security and privacy concerns of performing an evaluation and management service by telephone and the availability of in person appointments. I also discussed with the patient that there may be a patient responsible charge related to this service. The patient expressed understanding and agreed to proceed. Patient location home Provider location: home office History of Present Illness: 39   years old currently single Caucasian male living with his fiance and 7 years old son working as an Personnel officer. He initially referred himself for management of bipolar disorder and insomnia. He has seen dr. Claudina Lick in past  at [facility] wanted to change provider.  Continues to do fair on meds, blood work still not done says had deaths in family  Has support from friends, lost [place] father he was attached to Modifying factor: work Stress; grand father death  Psychosis none Duration ; more then 10 years plus Substance Abuse History in the last 12 months:  Yes.   but did not endorse regular use  Marijuana use infrequent or during the weekends  Consequences of Substance Abuse: Medical Consequences:  mood liability  Past Medical History:  Past Medical History:  Diagnosis Date  . Bipolar disorder (HCC)    History reviewed. No pertinent surgical history.  Family Psychiatric History: says Dad probably had bipolar. Mom has anxiety  Family History:  Family History   Problem Relation Age of Onset  . Anxiety disorder Mother   . Anxiety disorder Sister   . Depression Sister   . ADD / ADHD Brother     Social History:   Social History   Socioeconomic History  . Marital status: Single    Spouse name: Not on file  . Number of children: Not on file  . Years of education: Not on file  . Highest education level: Not on file  Occupational History  . Not on file  Tobacco Use  . Smoking status: Former Smoker    Years: 17.00    Types: Cigarettes    Quit date: 06/09/2017    Years since quitting: 3.0  . Smokeless tobacco: Never Used  . Tobacco comment: reduce # of cigarettes  Vaping Use  . Vaping Use: Some days  Substance and Sexual Activity  . Alcohol use: No  . Drug use: Yes    Types: Marijuana    Comment: weekends   . Sexual activity: Yes    Partners: Female  Other Topics Concern  . Not on file  Social History Narrative  . Not on file   Social Determinants of Health   Financial Resource Strain:   . Difficulty of Paying Living Expenses: Not on file  Food Insecurity:   . Worried About Programme researcher, broadcasting/film/video in the Last Year: Not on file  . Ran Out of Food in the Last Year: Not on file  Transportation Needs:   . Lack of Transportation (Medical): Not on file  . Lack of  Transportation (Non-Medical): Not on file  Physical Activity:   . Days of Exercise per Week: Not on file  . Minutes of Exercise per Session: Not on file  Stress:   . Feeling of Stress : Not on file  Social Connections:   . Frequency of Communication with Friends and Family: Not on file  . Frequency of Social Gatherings with Friends and Family: Not on file  . Attends Religious Services: Not on file  . Active Member of Clubs or Organizations: Not on file  . Attends Banker Meetings: Not on file  . Marital Status: Not on file     Allergies:   Allergies  Allergen Reactions  . Penicillins Hives    Metabolic Disorder Labs: No results found for: HGBA1C,  MPG No results found for: PROLACTIN No results found for: CHOL, TRIG, HDL, CHOLHDL, VLDL, LDLCALC   Current Medications: Current Outpatient Medications  Medication Sig Dispense Refill  . lamoTRIgine (LAMICTAL) 100 MG tablet Take 1 tablet (100 mg total) by mouth 3 (three) times daily. 90 tablet 4  . lithium 600 MG capsule TAKE 1 CAPSULE BY MOUTH AT NIGHT 30 capsule 5  . Multiple Vitamin (THERA) TABS Take by mouth.    . traZODone (DESYREL) 100 MG tablet Take 1 tablet (100 mg total) by mouth at bedtime as needed. 30 tablet 5   No current facility-administered medications for this visit.      Psychiatric Specialty Exam: Review of Systems  Cardiovascular: Negative for palpitations.  Skin: Negative for rash.  Psychiatric/Behavioral: Negative for depression and suicidal ideas.    There were no vitals taken for this visit.There is no height or weight on file to calculate BMI.  General Appearance:  Eye Contact:  Speech:  Normal Rate  Volume:  Normal  Mood:fair  Affect:  congruent  Thought Process:  Goal Directed  Orientation:  Full (Time, Place, and Person)  Thought Content:  Logical  Suicidal Thoughts:  No  Homicidal Thoughts:  No  Memory:  Immediate;   Fair Recent;   Fair  Judgement:  Fair  Insight:  Good  Psychomotor Activity:  Normal  Concentration:  Concentration: Fair and Attention Span: Fair  Recall:  Good  Fund of Knowledge:Good  Language: Good  Akathisia:  Negative  Handed:  Right  AIMS (if indicated):    Assets:  Financial Resources/Insurance Social Support  ADL's:  Intact  Cognition: WNL  Sleep:  fair    Treatment Plan Summary: Medication management and Plan as follows  Bipolar possible mixed: remains fair, continue lithium, lamictal, no rash Recommend blood work but he has not got done  Insomnia' some awakenings' reported by wife to him, discussed sleep hygiene, avoid nicotine , continue trazadone  Fu 6 m . Renewed meds '  I discussed the assessment  and treatment plan with the patient. The patient was provided an opportunity to ask questions and all were answered. The patient agreed with the plan and demonstrated an understanding of the instructions.   The patient was advised to call back or seek an in-person evaluation if the symptoms worsen or if the condition fails to improve as anticipated.  Time spent non face to face [name], MD 9/21/20214:09 PM

## 2020-12-11 ENCOUNTER — Other Ambulatory Visit (HOSPITAL_COMMUNITY): Payer: Self-pay | Admitting: Psychiatry

## 2020-12-26 ENCOUNTER — Telehealth (HOSPITAL_COMMUNITY): Payer: BC Managed Care – PPO | Admitting: Psychiatry

## 2021-01-02 ENCOUNTER — Telehealth (HOSPITAL_COMMUNITY): Payer: BC Managed Care – PPO | Admitting: Psychiatry

## 2021-01-14 ENCOUNTER — Other Ambulatory Visit (HOSPITAL_COMMUNITY): Payer: Self-pay | Admitting: Psychiatry

## 2021-04-24 ENCOUNTER — Other Ambulatory Visit (HOSPITAL_COMMUNITY): Payer: Self-pay | Admitting: Psychiatry

## 2021-05-25 ENCOUNTER — Other Ambulatory Visit (HOSPITAL_COMMUNITY): Payer: Self-pay | Admitting: Psychiatry

## 2021-06-24 ENCOUNTER — Other Ambulatory Visit (HOSPITAL_COMMUNITY): Payer: Self-pay | Admitting: Psychiatry
